# Patient Record
Sex: Female | Born: 1940 | Race: White | Hispanic: No | Smoking: Former smoker
Health system: Southern US, Community
[De-identification: ages and names within clinical notes are randomized; demographics above are authoritative.]

## PROBLEM LIST (undated history)

## (undated) DIAGNOSIS — I10 Essential (primary) hypertension: Secondary | ICD-10-CM

## (undated) DIAGNOSIS — E785 Hyperlipidemia, unspecified: Secondary | ICD-10-CM

## (undated) DIAGNOSIS — I5181 Takotsubo syndrome: Secondary | ICD-10-CM

## (undated) DIAGNOSIS — F419 Anxiety disorder, unspecified: Secondary | ICD-10-CM

## (undated) DIAGNOSIS — L409 Psoriasis, unspecified: Secondary | ICD-10-CM

## (undated) DIAGNOSIS — E119 Type 2 diabetes mellitus without complications: Secondary | ICD-10-CM

## (undated) DIAGNOSIS — Z9289 Personal history of other medical treatment: Secondary | ICD-10-CM

## (undated) DIAGNOSIS — K8689 Other specified diseases of pancreas: Secondary | ICD-10-CM

## (undated) DIAGNOSIS — C801 Malignant (primary) neoplasm, unspecified: Secondary | ICD-10-CM

## (undated) DIAGNOSIS — R51 Headache: Secondary | ICD-10-CM

## (undated) DIAGNOSIS — Z85038 Personal history of other malignant neoplasm of large intestine: Secondary | ICD-10-CM

## (undated) DIAGNOSIS — M549 Dorsalgia, unspecified: Secondary | ICD-10-CM

## (undated) HISTORY — DX: Personal history of other malignant neoplasm of large intestine: Z85.038

## (undated) HISTORY — DX: Takotsubo syndrome: I51.81

## (undated) HISTORY — DX: Type 2 diabetes mellitus without complications: E11.9

## (undated) HISTORY — DX: Hyperlipidemia, unspecified: E78.5

---

## 2007-02-20 DIAGNOSIS — C801 Malignant (primary) neoplasm, unspecified: Secondary | ICD-10-CM

## 2007-02-20 DIAGNOSIS — Z9289 Personal history of other medical treatment: Secondary | ICD-10-CM

## 2007-02-20 HISTORY — DX: Malignant (primary) neoplasm, unspecified: C80.1

## 2007-02-20 HISTORY — DX: Personal history of other medical treatment: Z92.89

## 2007-02-20 HISTORY — PX: COLOSTOMY REVERSAL: SHX5782

## 2007-05-25 ENCOUNTER — Ambulatory Visit: Payer: Self-pay | Admitting: Cardiology

## 2007-05-26 HISTORY — PX: COLOSTOMY: SHX63

## 2007-05-29 ENCOUNTER — Inpatient Hospital Stay (HOSPITAL_COMMUNITY): Admission: AD | Admit: 2007-05-29 | Discharge: 2007-06-11 | Payer: Self-pay | Admitting: Cardiology

## 2007-05-29 ENCOUNTER — Ambulatory Visit: Payer: Self-pay | Admitting: Cardiology

## 2007-06-05 ENCOUNTER — Ambulatory Visit: Payer: Self-pay | Admitting: Internal Medicine

## 2007-06-05 ENCOUNTER — Encounter: Payer: Self-pay | Admitting: Cardiology

## 2007-06-10 ENCOUNTER — Encounter: Payer: Self-pay | Admitting: Cardiology

## 2007-06-26 ENCOUNTER — Ambulatory Visit: Payer: Self-pay | Admitting: Cardiology

## 2007-07-15 ENCOUNTER — Ambulatory Visit: Payer: Self-pay | Admitting: Cardiology

## 2007-07-15 HISTORY — PX: HEMICOLECTOMY: SHX854

## 2007-07-15 HISTORY — PX: APPENDECTOMY: SHX54

## 2007-07-15 HISTORY — PX: BILATERAL SALPINGOOPHORECTOMY: SHX1223

## 2008-07-16 IMAGING — CT CT ABDOMEN W/ CM
2 of 5 series · 17 of 46 positions shown, 19 images · IV contrast (APPLIED)
Comparison: None

CT ABDOMEN

CLINICAL DATA: Recently postop for diverting colostomy for colon
mass.  Pain, fever and elevated white count.  Evaluate for abscess
or other complication.

CT ABDOMEN AND PELVIS WITH CONTRAST
TECHNIQUE: Multidetector CT imaging of the abdomen and pelvis was
performed using the standard protocol following bolus
administration of intravenous contrast.
Contrast: 80 ml Qmnipaque-H77 and oral contrast

[Series 3: abd/pelv with 5.0 b31f st · axial · 0.65mm/px · z∈[-326,+28]mm · 14 of 81 slices shown, 16 images]
[im 5/81  soft-tissue]
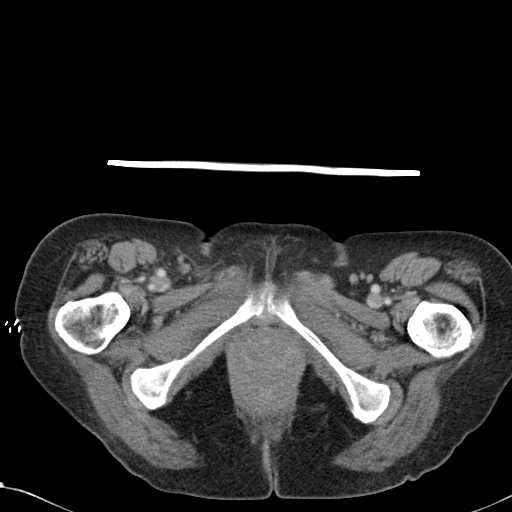
[im 5/81  bone]
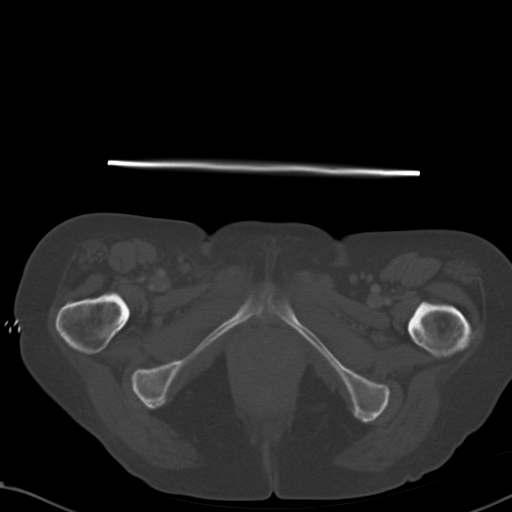
[im 9/81  soft-tissue]
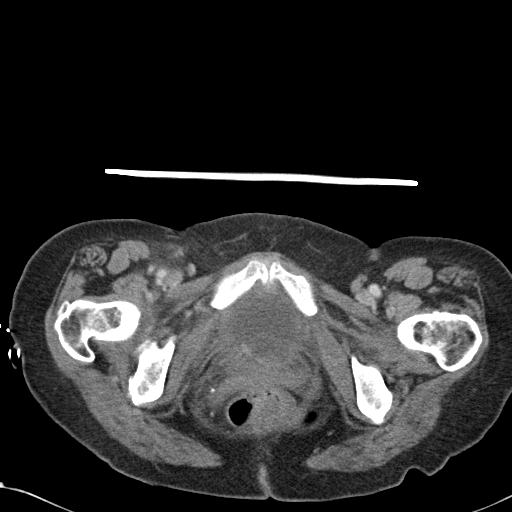
[im 18/81  soft-tissue]
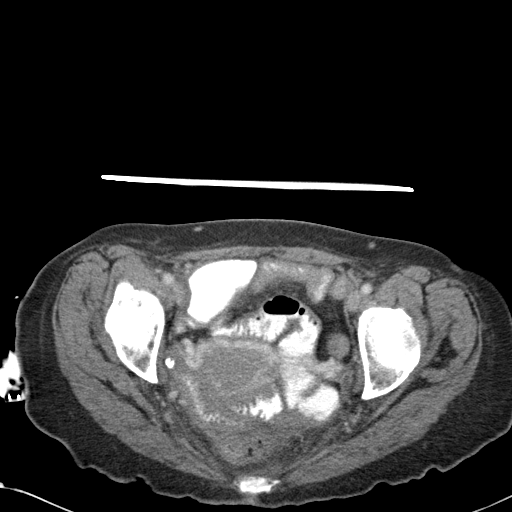
[im 23/81  soft-tissue]
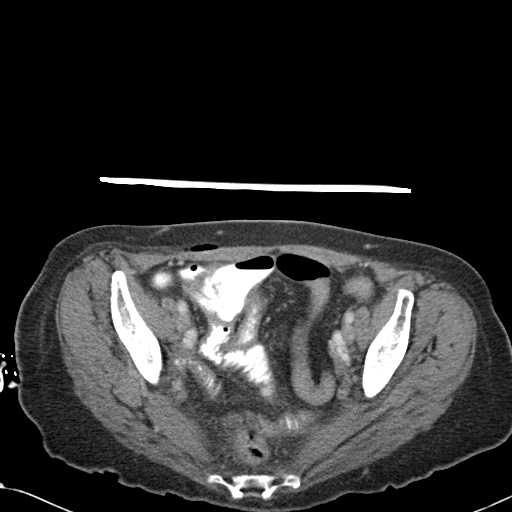
[im 27/81  soft-tissue]
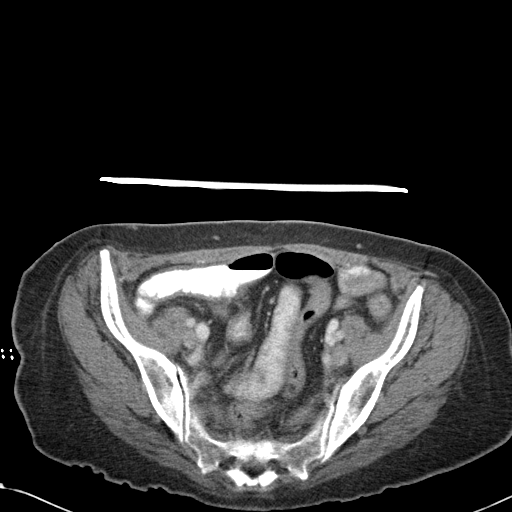
[im 32/81  soft-tissue]
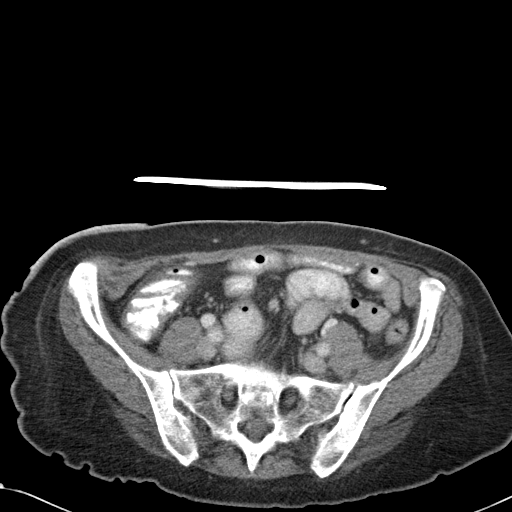
[im 36/81  soft-tissue]
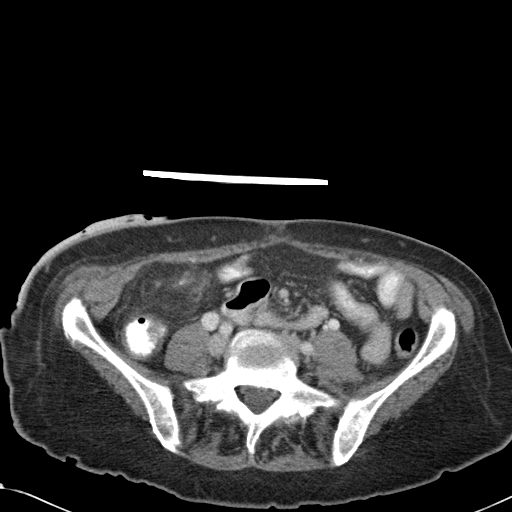
[im 45/81  soft-tissue]
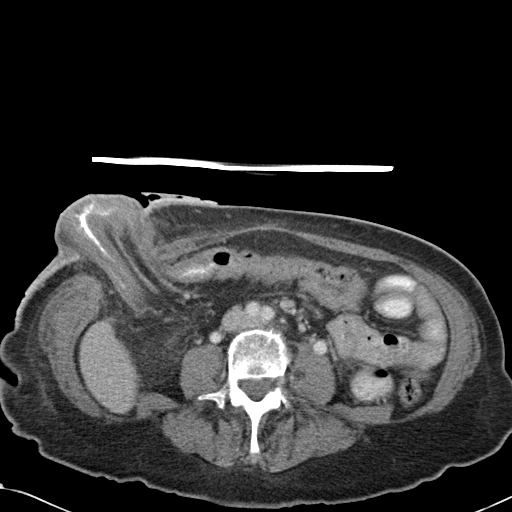
[im 49/81  soft-tissue]
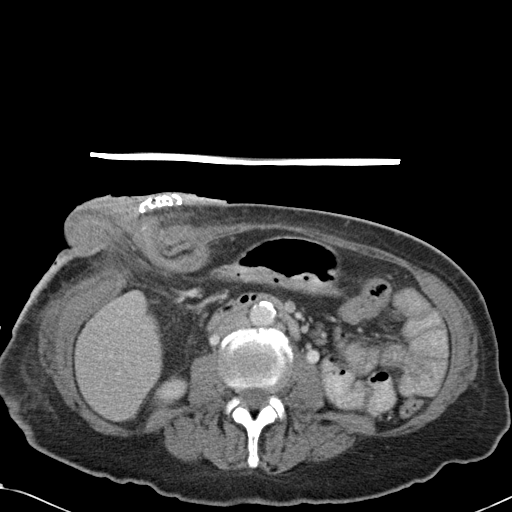
[im 49/81  bone]
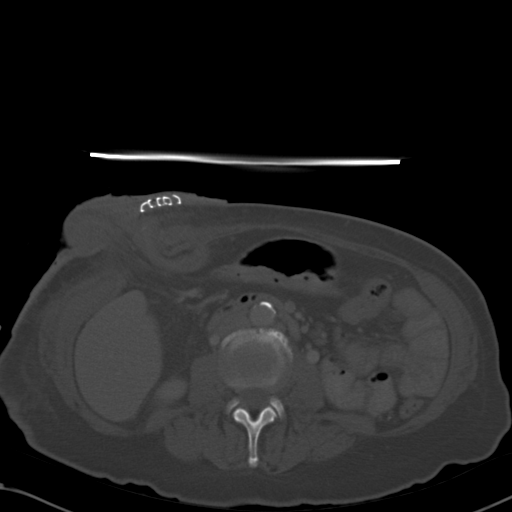
[im 54/81  soft-tissue]
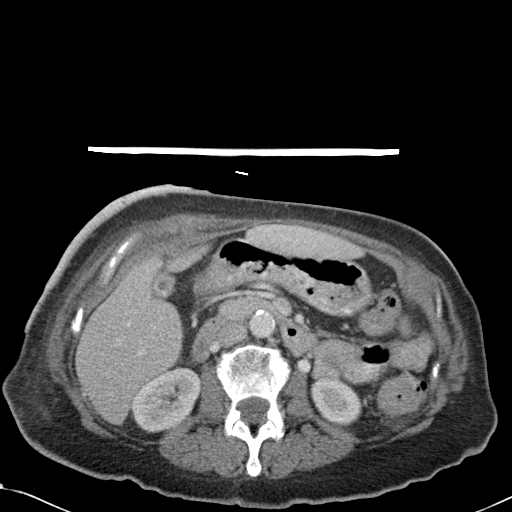
[im 58/81  soft-tissue]
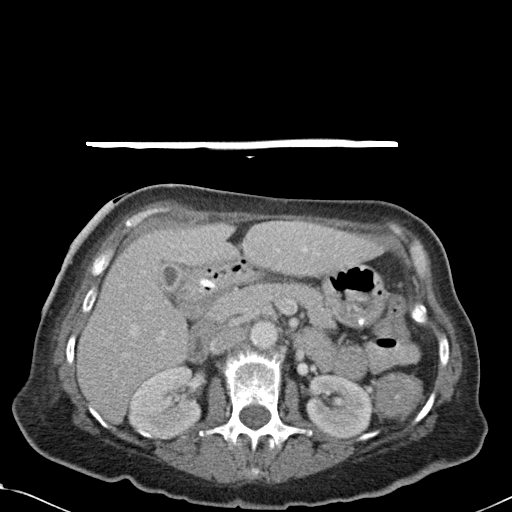
[im 63/81  soft-tissue]
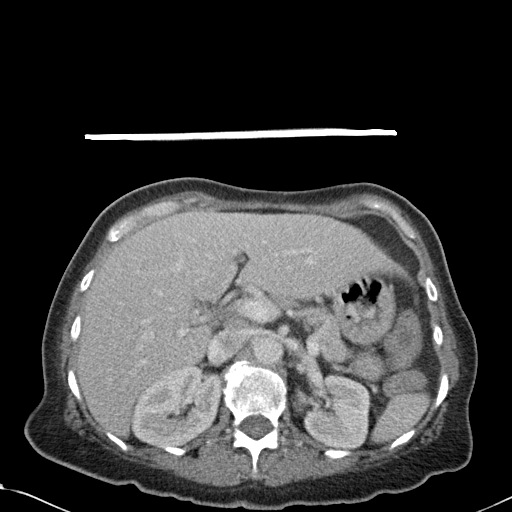
[im 72/81  soft-tissue]
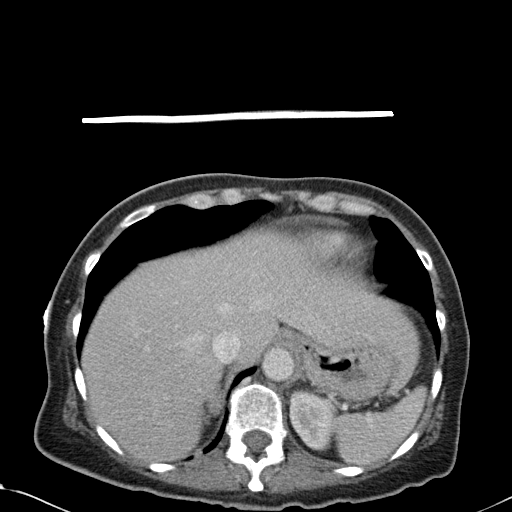
[im 76/81  soft-tissue]
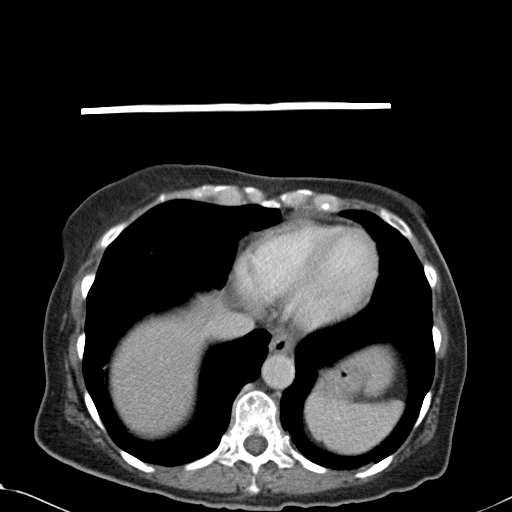

[Series 602: coronal abd/pel · coronal · 0.79mm/px · 3 of 99 slices shown]
[im 33/99  soft-tissue]
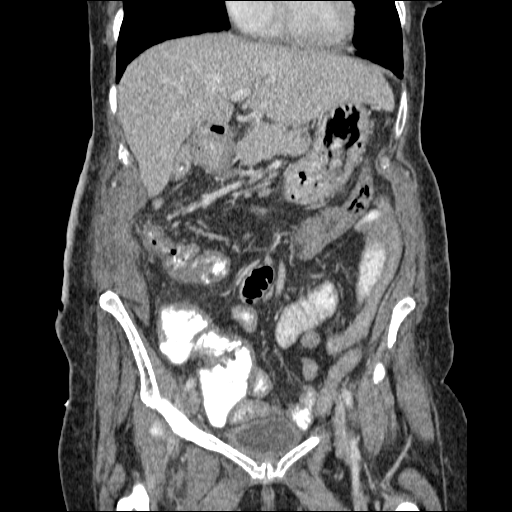
[im 44/99  soft-tissue]
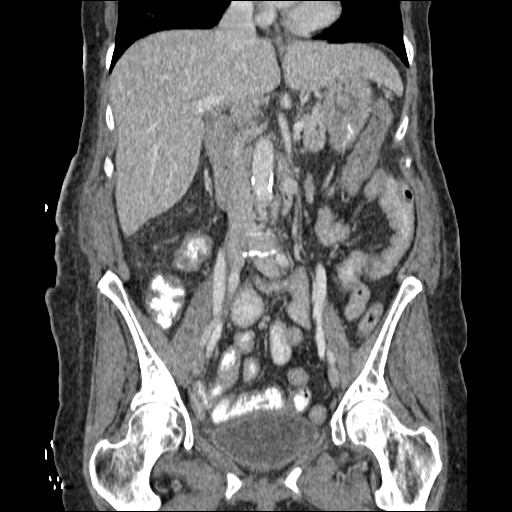
[im 55/99  soft-tissue]
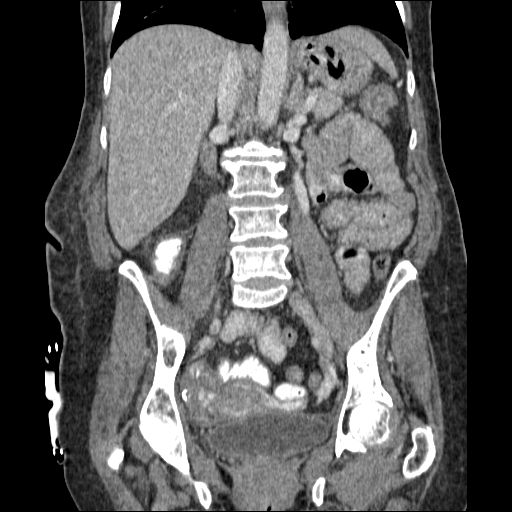

[17 of 46 positions shown; findings below may reference images not displayed]

FINDINGS: The abdominal parenchymal organs are normal in
appearance.  There is no evidence of hydronephrosis.  No mass or
adenopathy identified.

A diverting colostomy is seen within the right abdomen there is
mild wall thickening of the colostomy loop, which may be due to
postop edema, although hemorrhage, colitis, or ischemia cannot
definitely be excluded.  No other areas of bowel wall thickening
are seen there is no evidence of intra-abdominal inflammatory
process or abnormal fluid collections.  There is no evidence of
bowel obstruction. Tiny gallstone is incidentally noted.
IMPRESSION: 1.  Mild wall thickening of the diverting colostomy loop in the
right abdominal wall.  This may be due to postop edema, although
hemorrhage, colitis, or ischemia cannot definitely be excluded.
Clinical correlation is recommended.
2.  No evidence of abscess or other complication.
3.  Tiny gallstone incidentally noted.

CT PELVIS
FINDINGS: There is no evidence of pelvic mass or inflammatory
process.  There is evidence of abscess or free fluid.  There is no
evidence of dilated bowel loops.  Small bowel gas is seen in the
urinary bladder, likely due to recent bladder catheterization.
IMPRESSION: No evidence of pelvic abscess or other acute findings.

## 2010-07-04 NOTE — Cardiovascular Report (Signed)
[name], [name]         ACCOUNT NO.:  [id number]   MEDICAL RECORD NO.:  [id number]           PATIENT TYPE:   LOCATION:                                 FACILITY:   PHYSICIAN:  Arturo Morton. Riley Kill, MD, FACCDATE OF BIRTH:  1940/11/26   DATE OF PROCEDURE:  DATE OF DISCHARGE:                            CARDIAC CATHETERIZATION   INDICATIONS:  The patient is a 70 year old who underwent abdominal  surgery.  She has evidence of myocardial infarction with wall motion  abnormality and T-wave inversion in the anterior precordial leads.  Current study was done to assess coronary anatomy.  She was brought to  the lab on a dopamine drip.  Risks have been reviewed by Dr. Myrtis Ser in  detail with the family.  I hae re-reviewed these with the patient and  she was agreeable to proceed.   PROCEDURES:  1. Left heart catheterization.  2. Selective coronary arteriography.  3. Selective left ventriculography.   DESCRIPTION OF THE PROCEDURE:  The patient was brought to the cath lab  and prepped and draped in the usual fashion.  She was on a dopamine  drip.  Through an anterior puncture, the right femoral artery was easily  entered.  A 5-French sheath was then placed.  Views of the left and  right coronary were then obtained in multiple angiographic projections.  Central aortic and left ventricular pressures were measured with  pigtail.  Ventriculography was performed in the RAO projection.  I then  spoke with Dr. Myrtis Ser by phone.  The patient's family was not available  post procedure.  She tolerated the procedure well.   HEMODYNAMIC DATA:  1. Central aortic pressure 121/61, mean 85.  2. Left ventricular pressure 127/20.  3. There was no gradient pullback across the aortic valve.   ANGIOGRAPHIC DATA:  1. Ventriculography was done in the RAO projection.  Overall, left      ventricular function was diminished.  The anterobasal and      inferobasal segments moved well.  There was a large apical  wall      motion abnormality compatible with takasubo finding.  There did not      appear to be significant mitral regurgitation.  The ejection      fraction will be estimated in the 35-40% range.  2. On plain fluoroscopy, there was evidence of calcification in both      the circumflex and left anterior descending.  3. The left main is free of critical disease and is short.  4. The left anterior descending artery courses to the apex.  There are      several small diagonal branches.  There is perhaps mild      irregularity of the lumen with no more than about 20-30% narrowing      in the mid left anterior descending.  Overall, there was no      evidence of high-grade obstruction.  There were several smaller      diagonal branches.  5. The circumflex was a large-caliber vessel with minor luminal      irregularity in the midportion.  There was a very large distal  circumflex marginal branch with several sub-branches, and all of      this appears to be free of critical disease.  There was a small      atrioventricular branch, which was without significant narrowing.  6. The right coronary artery is a small-to-moderate-sized vessel      providing a smaller posterior descending and posterolateral system,      all of which appear free of critical disease.   CONCLUSIONS:  1. Overall reduction of left ventricular function with an apical wall      motion abnormality.  2. No evidence of high-grade obstruction.  3. 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   DISPOSITION:  1. We will try to taper the dopamine.  2. Her intravenous heparin will be discontinued.  3. In 24 hours to 48 hours, she will be started on low-dose Lovenox to      prevent DVT.  She will be followed by Dr. Myrtis Ser in the hospital.      Arturo Morton. Riley Kill, MD, [facility]  Electronically Signed     TDS/MEDQ  D:  05/30/2007  T:  05/31/2007  Job:  [id number]   cc:   Luis Abed, MD, [facility]  [facility]  Kathaleen Maser. Cleotis Nipper, M.D.

## 2010-07-04 NOTE — Assessment & Plan Note (Signed)
[facility]                          [facility] NOTE   NAME:SHOCKLEYRashena, [name]                MRN:          [id number]  DATE:06/26/2007                            DOB:          08-Feb-1941    [name] is here for cardiology follow up.  She is doing very well  at this time.  She had a complicated course recently.  She was in  [facility] with an abdominal mass.  She had a colostomy placed as  a temporizing procedure because further surgery could not be done at  that time.  During the surgery, there were some hemodynamic difficulties  and she had some EKGs abnormalities afterwards.  She had an echo showing  that she had the anterior and septal wall motion abnormalities, and she  had enzyme elevation, and I was quite worried about her in that setting.  We decided that to transfer her to [facility].  At [facility], she had excess  output from her colostomy.  She had persistent need for fluid and  dopamine.  Eventually, it was felt that her fluid and dopamine could be  cut back and that her systolic pressure was stable for her in the range  of 90.  She stabilized over time at [facility] and ultimately underwent  cardiac catheterization.  Her cath showed that she had mild  nonobstructive coronary disease.  It was felt that the most likely  scenario was Takotsubo cardiomyopathy.  In fact, while in the hospital,  her LV function began to improve with an ejection fraction as high as  50%.  Also, while in the hospital, she had an E-coli urinary tract  infection that was a treated successfully.  Ultimately, she was  discharged home, and I was personally in touch with Dr. Cleotis Nipper to  make sure he knew that she had been discharged home.  While at [facility], she had been seen by the surgical group including Dr. Zachery Dakins.  The rod was removed from her colostomy.  She stabilized, and she is now  doing well.  Since discharge, she has seen Dr.  Cleotis Nipper back, and her  definitive surgery is scheduled for Jul 15, 2007.   PAST MEDICAL HISTORY:   ALLERGIES:  NO KNOWN DRUG ALLERGIES.   MEDICATIONS:  1. Starlix.  2. Lipitor 20.  3. Fluoxetine.  4. Lorazepam.  5. Aspirin 81 mg.   OTHER MEDICAL PROBLEMS:  See the list below.   REVIEW OF SYSTEMS:  She is actually doing remarkably well, and her  review of systems today is negative.   PHYSICAL EXAMINATION:  VITAL SIGNS:  Blood pressure today is 90/60 with  a pulse rate of 70.  Her weight is 115 pounds.  GENERAL:  The patient is here with her husband.  She is oriented to  person, time and place.  Affect is normal.  HEENT:  No xanthelasma.  She has normal extraocular motion.  There are  no carotid bruits.  There is no jugular venous distention.  LUNGS:  Clear.  Respiratory effort is not labored.  CARDIAC:  An S1 with an S2.  There are  no clicks or significant murmurs.  ABDOMEN:  Her abdomen is soft but she has the mass that has been present  before.  EXTREMITIES:  She has no significant peripheral edema.   LABORATORY DATA:  No labs are done at this time.   PROBLEMS:  1. Mild coronary disease by catheterization.  2. Status post Takotsubo cardiomyopathy with improvement of LV      function.  3. Ejection fraction currently in the 50% range.  4. Status post a colostomy that is in place.  5. Abdominal mass for which she still needs surgery.  6. Episode of excess fluid output from her colostomy that improved      over time.  7. Continue low blood pressure.  She is not symptomatic with this.      This needs to be kept in mind over time.  This is not a sign of LV      dysfunction in her case.  8. Diabetes.  9. Hyperlipidemia.  10.Longstanding tobacco abuse.  Unfortunately, she has not stopped.  11.Status post urinary tract infection that was treated completely      while at the hospital.   I have carefully re-reviewed all of her issues.  I believe that she is  stable.  I  believe that she can undergo her surgery successfully to have  the mass removed from her abdomen.  No further cardiac workup is needed.  We will be available to help as needed.     Luis Abed, MD, [facility]  Electronically Signed    JDK/MedQ  DD: 06/26/2007  DT: 06/26/2007  Job #: [id number]   cc:   Sherilyn Cooter A. Cleotis Nipper, M.D.

## 2010-07-04 NOTE — Consult Note (Signed)
[name], [name]         ACCOUNT NO.:  [id number]   MEDICAL RECORD NO.:  [id number]          PATIENT TYPE:  INP   LOCATION:  2901                         FACILITY:  [facility]   PHYSICIAN:  Letha Cape, PA    DATE OF BIRTH:  1940-12-08   DATE OF CONSULTATION:  06/02/2007  DATE OF DISCHARGE:                                 CONSULTATION   REQUESTING PHYSICIAN:  Luis Abed, MD, [facility]   PRIMARY SURGEON:  Kathaleen Maser. Cleotis Nipper, M.D.   CONSULTING SURGEON:  Anselm Pancoast. Weatherly, M.D.   TIME OF CONSULTATION:  From 11:05 a.m. to 12:05 p.m.   REASON FOR CONSULTATION:  High colostomy output.   HISTORY OF PRESENT ILLNESS:  This is a 70 year old white female with a  history of diabetes as well as dyslipidemia who recently had a diverting  colostomy done by Dr. Cleotis Nipper in [facility] on April 6 for an  obstructing colon mass.  His plans were to take her back in  approximately 1 month, at that time to resect the colon mass.  However,  during surgery the patient began to exhibit signs of hypotension as well  as possible myocardial infarction changes on her EKG.  At that time, the  patient was transferred here to [facility], and a cardiology consult was  obtained at that time.  The patient was then taken to the cath lab which  showed clear arteries with a decrease in ejection fraction of 35%-40%.  At this time, that led to a diagnosis of Tako-Tsubo syndrome.  At this  time, the patient was placed on dopamine as well as large amounts of IV  fluids to help maintain the patient's blood pressure.  After speaking to  the patient, the patient states that she has had this high output from  her colostomy ever since her surgery.  This high output is to be  expected the first several days after surgery due to the patient being  obstructive prior to surgery.  However, the last several days could be  from a number of other things.  The patient states that at this time she  is tolerating a  diet without any nausea or vomiting or any abdominal  pain.  She is having some soft formed stools in her ostomy with a tan  liquid otherwise.  The patient is also having an increase in urine  output, but has a slightly low BUN of 4 and a creatinine of 0.53.  At  this time, we were consulted to help manage the patient's high output  from her colostomy.   REVIEW OF SYSTEMS:  See HPI.  Otherwise, all other systems at this time  are negative.   FAMILY HISTORY:  Noncontributory at this time.   PAST MEDICAL HISTORY:  Includes:  1. Diabetes mellitus type 2.  2. Dyslipidemia.  3. Depression   PAST SURGICAL HISTORY:  1. Tonsillectomy.  2. Status post diverting colostomy on May 26, 2007.   SOCIAL HISTORY:  The patient is 70 years old and is currently married to  her husband.  Together they have 4 grown children, 2 boys and 2 girls.  The patient states that she has smoked since she was in her early 33s  approximately 2 packs a day.  She denies any use of alcohol or other  illicit drugs.  The patient is also currently retired and lives in  [place], [place].   ALLERGIES:  None.   MEDICATIONS:  1. Ativan 0.5 mg t.i.d.  2. Starlix 120 mg t.i.d.  3. Prozac 20 mg daily.  4. Lipitor 10 mg daily.  5. Ibuprofen over-the-counter as needed.   PHYSICAL EXAM:  GENERAL:  This is a pleasant 70 year old white female  who is lying in bed in no acute distress.  VITALS:  Temperature 98, blood pressure 93/58, pulse 77, and  respirations 23.  EYES:  Sclerae are noninjected.  Pupils equal, round, and reactive to  light.  EARS, NOSE, MOUTH, AND THROAT:  Ears and nose no obvious masses or  lesions or rhinorrhea.  Mouth reveals poor dentition.  Otherwise pink  and moist.  Throat shows no exudate.  NECK:  Supple.  Trachea midline.  No thyromegaly.  LUNGS:  Clear to auscultation bilaterally with no wheezes, rhonchi, or  rales noted.  Respiratory effort is normal.  HEART:  Regular rate and rhythm.   Normal S1 and S2 with no obvious  murmurs, gallops, or rubs noted.  Carotid +2 and pedal pulses are noted  bilaterally.  Chest is symmetrical.  ABDOMEN:  Soft, nondistended, and slightly tender around her colostomy.  She does have active bowel sounds.  Her stoma at this time is pink with  about 80% beefy red tissue and 20% fluff.  Her stoma at this time is  quite edematous and it is noted that she has a rod in place.  At this  time, the rod is causing some leakage with periostomy skin excoriation.  Otherwise, at this time she has some air in her bag with some soft  formed stools, otherwise with tan liquids.  MUSCULOSKELETAL:  All 4 extremities are symmetrical with no cyanosis,  clubbing, or edema.  SKIN:  No obvious masses, lesions, or rashes.  NEURO:  Cranial nerves II-XII are grossly intact.  Deep tendon reflex  exam is deferred at this time.  PSYCH:  The patient is alert and oriented x3 with an appropriate affect.   LABS AND DIAGNOSTICS:  White blood cell count is 10,200, hemoglobin  11.3, hematocrit 33.1, and platelets 335,000.  Sodium 134, potassium  2.8, BUN 4, creatinine 0.53, and glucose 157.  BNP 355.  Her albumin  which was measured on May 30, 2007, is 1.8.  There are no diagnostics  at this time.   IMPRESSION:  1. High colostomy output.  2. Status post diverting colostomy on May 26, 2007, by Dr. Cleotis Nipper      at [facility].  3. Hypokalemia.  4. Hypoalbuminemia.  5. Tako-Tsubo syndrome.  6. Diabetes.   PLAN:  At this time, we feel that the patient is probably having a  significant amount of output in her colostomy due to this significant  amount of IV fluids that she is receiving because of her hypotension.  Therefore at this time, we would recommend that if possible to begin  decreasing her IV fluids to see if this will help decrease her colostomy  output.  Otherwise, we will check an abdominal x-ray as well as the  patient's stools for Clostridium  difficile to rule out any other  possible reason for her high output.  As far as the leaking around her  colostomy, I  have discussed this with the wound ostomy care nurse and  she states that she is leaking due to not being able to get a good seal  around the colostomy due to the patient's rod.  She states that usually  this is discontinued after 7-10 days, and she recommends that her  primary physician speak with Dr. Cleotis Nipper to see if at this time it is  okay to remove the rod.  This would then allow for a better seal on the  patient's colostomy and would also decrease the amount of skin  excoriation the patient has around her colostomy.  Otherwise, at this  time any further recommendations will be per Dr. Zachery Dakins after his  evaluation of the patient.      Letha Cape, PA     KEO/MEDQ  D:  06/02/2007  T:  06/03/2007  Job:  [id number]   cc:   Luis Abed, MD, [facility]  Kathaleen Maser. Cleotis Nipper, M.D.

## 2010-07-04 NOTE — Discharge Summary (Signed)
[name], [name]         ACCOUNT NO.:  [id number]   MEDICAL RECORD NO.:  [id number]          PATIENT TYPE:  INP   LOCATION:  2010                         FACILITY:  [facility]   PHYSICIAN:  Gerrit Friends. Dietrich Pates, MD, FACCDATE OF BIRTH:  [birth year]   DATE OF ADMISSION:  05/29/2007  DATE OF DISCHARGE:                               DISCHARGE SUMMARY   PRIMARY CARDIOLOGIST:  Luis Abed, MD.   GENERAL SURGEON:  Kathaleen Maser. Cleotis Nipper, MD, in [place]   DISCHARGE DIAGNOSIS:  Non-ST-elevation myocardial infarction.   SECONDARY DIAGNOSES:  1. Takotsubo cardiomyopathy  with an ejection fraction of 35-40% by 2-      D echocardiogram at [facility], improved to 50% on April 21      here at [facility].  2. Type 2 diabetes mellitus.  3. Hyperlipidemia.  4. Longstanding tobacco abuse.  5. Family history of coronary artery disease.  6. 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  7. Excess output from her colostomy.  8. Hypotension requiring IV fluid resuscitation and vasopressors.  9. Urinary tract infection   ALLERGIES:  No known drug allergies.   PROCEDURES:  1. Left heart cardiac catheterization.  2. CT of the abdomen and pelvis showing wall thickening of diverting      colostomy loop in the right abdominal wall.  No abscess or other      complication.  No pelvic mass or other acute findings.   HISTORY OF PRESENT ILLNESS:  A 70 year old female who was admitted to  [facility] May 25, 2007, secondary to progressive abdominal  discomfort.  A CT of her abdomen was performed showing an obstructing  mass in the proximal descending colon suspicious for colon carcinoma as  well as a 2.5-mm retroperitoneal lymph node.  This was suspicious for  metastatic disease.  The patient was evaluated by Dr. Cleotis Nipper and  underwent a transverse loop colostomy on April 6 for decompression with  a plan for a resection  of obstructing mass in the future.  The patient  tolerated the procedure, however postoperatively became tachycardic with  rates in the 160s and was also noted to be hypotensive, requiring fluid  resuscitation and dopamine.  Cardiac markers were evaluated and her  troponin was elevated at 0.57.  Cardiology was consulted and a 2-D  echocardiogram performed revealing an EF of 35-40% with anteroseptal and  apical wall motion abnormality.  A decision was made at this point to  transfer the patient to [facility] for further evaluation and management  of non-ST-elevation MI.   HOSPITAL COURSE:  Following arrival to [facility] the patient was noted  to have excess colostomy output with persistent hypotension requiring IV  fluids and dopamine therapy.  A left heart cardiac catheterization was  performed April 10 revealing nonobstructive coronary artery disease with  an EF of 35-40% with anteroapical akinesis.  It was felt that in this  scenario her troponin and wall motion abnormalities were most likely  related to takotsubo cardiomyopathy.  Hypotension prevented initiation  of beta blocker or ACE inhibitor therapy.  Due to persistent excess  colostomy drainage, general surgery was consulted as well as wound care.  Both recommended removal of the rod from the stoma and once this was  performed, she was noted to have decrease in drainage.  Stool for C.  difficile was sent off and was negative.  On April 16 Ms. Pelzer was  noted to have an elevated white count of 956[address] as well as intermittent  low-grade fevers.  A urinalysis was performed and did confirm UTI.  The  patient was seen by infectious disease secondary to her leukocytosis and  complex case and initially was treated with broad-spectrum antibiotics.  Urine culture grew greater than 1000 colonies of E. coli and the  patient's antibiotics were switched to Rocephin and then finally oral  amoxicillin, which she will require for additional  days of.  Repeat  echocardiography was performed April 16 to reevaluate her LV function  and at that time it had improved to 50-55%.  Follow-up echo April 21  showed an EF of 50% with mild septal hypokinesis and mild periapical  hypokinesis.  Overall Ms. Bibby has improved significantly.  We have  arranged for a home health RN for ostomy care.  She will follow up with  Dr. Cleotis Nipper in the near future with plans for colectomy down the road.  We will also arrange for follow-up with Dr. Myrtis Ser in [place].   DISCHARGE LABS:  Hemoglobin 9.9, hematocrit 28.1, WBC 4.4, platelets  452.  Sodium 139, potassium 3.5, chloride 105, CO2 25, BUN 9, creatinine  0.53, glucose 114..  Total cholesterol 72, triglycerides 101, HDL 19,  LDL 33.  Urinalysis showed positive nitrite, 11-20 wbc's, many bacteria.  Urine culture grew E. coli.  C. difficile was negative.  Blood cultures  were negative.   DISPOSITION:  The patient is being discharged home today in good  condition.   FOLLOW-UP PLANS AND APPOINTMENTS:  We have arranged for follow-up with  Dr. Myrtis Ser on May 7 at 1:45 p.m.  She will follow up with Dr. Cleotis Nipper as  previously arranged.   DISCHARGE MEDICATIONS:  1. Aspirin 81 mg daily.  2. Prozac 20 mg daily.  3. Starlix 120 mg t.i.d. with meals.  4. Ativan 0.5 mg t.i.d.  5. Amoxicillin 500 mg t.i.d. x4 more days.  6. Multivitamin daily.  7. Lipitor 10 mg daily.   OUTSTANDING LAB STUDIES:  None.   DURATION OF DISCHARGE ENCOUNTER:  90 minutes including physician time.      Nicolasa Ducking, ANP      Gerrit Friends. Dietrich Pates, MD, [facility]  Electronically Signed    CB/MEDQ  D:  06/11/2007  T:  06/11/2007  Job:  [id number]   cc:   Sherilyn Cooter A. Cleotis Nipper, M.D.

## 2010-11-14 LAB — BASIC METABOLIC PANEL
BUN: 3 — ABNORMAL LOW
BUN: 4 — ABNORMAL LOW
BUN: 6
BUN: 9
BUN: 9
CO2: 24
CO2: 24
CO2: 28
Calcium: 8.2 — ABNORMAL LOW
Calcium: 8.7
Calcium: 9.4
Chloride: 103
Chloride: 104
Chloride: 106
Chloride: 110
Chloride: 102
Chloride: 98
Creatinine, Ser: 0.48
Creatinine, Ser: 0.49
Creatinine, Ser: 0.52
Creatinine, Ser: 0.53
Creatinine, Ser: 0.53
Creatinine, Ser: 0.53
Creatinine, Ser: 0.66
GFR calc Af Amer: >60
GFR calc Af Amer: >60
GFR calc Af Amer: >60
GFR calc Af Amer: >60
GFR calc Af Amer: >60
GFR calc non Af Amer: >60
GFR calc non Af Amer: >60
GFR calc non Af Amer: >60
GFR calc non Af Amer: >60
GFR calc non Af Amer: >60
GFR calc non Af Amer: >60
Glucose, Bld: 137 — ABNORMAL HIGH
Glucose, Bld: 157 — ABNORMAL HIGH
Glucose, Bld: 114 — ABNORMAL HIGH
Glucose, Bld: 114 — ABNORMAL HIGH
Glucose, Bld: 120 — ABNORMAL HIGH
Potassium: 2.8 — ABNORMAL LOW
Potassium: 3.2 — ABNORMAL LOW
Potassium: 3.2 — ABNORMAL LOW
Potassium: 3.3 — ABNORMAL LOW
Potassium: 3.6
Potassium: 3.7
Potassium: 4.1
Potassium: 4.2
Sodium: 137
Sodium: 137
Sodium: 135
Sodium: 138

## 2010-11-14 LAB — URINE MICROSCOPIC-ADD ON

## 2010-11-14 LAB — URINALYSIS, ROUTINE W REFLEX MICROSCOPIC
Bilirubin Urine: NEGATIVE
Glucose, UA: NEGATIVE
Ketones, ur: NEGATIVE
Ketones, ur: NEGATIVE
Nitrite: POSITIVE — AB
Protein, ur: NEGATIVE
Protein, ur: NEGATIVE
Urobilinogen, UA: 0.2
pH: 5.5
pH: 6

## 2010-11-14 LAB — HEPARIN LEVEL (UNFRACTIONATED)
Heparin Unfractionated: <0.1 — ABNORMAL LOW
Heparin Unfractionated: <0.1 — ABNORMAL LOW

## 2010-11-14 LAB — CBC
HCT: 33.1 — ABNORMAL LOW
HCT: 33.3 — ABNORMAL LOW
HCT: 35.1 — ABNORMAL LOW
HCT: 38.6
HCT: 28.6 — ABNORMAL LOW
HCT: 31.9 — ABNORMAL LOW
Hemoglobin: 11.3 — ABNORMAL LOW
Hemoglobin: 11.6 — ABNORMAL LOW
Hemoglobin: 12
Hemoglobin: 13.1
Hemoglobin: 11.9 — ABNORMAL LOW
Hemoglobin: 9.9 — ABNORMAL LOW
MCHC: 33.8
MCHC: 35
MCHC: 34.2
MCHC: 35.3
MCV: 91.2
MCV: 91.5
MCV: 91.7
MCV: 90.4
MCV: 91.2
Platelets: 309
Platelets: 317
Platelets: 325
Platelets: 424 — ABNORMAL HIGH
Platelets: 452 — ABNORMAL HIGH
RBC: 3.48 — ABNORMAL LOW
RBC: 3.63 — ABNORMAL LOW
RBC: 3.64 — ABNORMAL LOW
RBC: 3.85 — ABNORMAL LOW
RBC: 4.22
RBC: 3.13 — ABNORMAL LOW
RBC: 3.47 — ABNORMAL LOW
RBC: 3.84 — ABNORMAL LOW
RDW: 13.1
RDW: 13.4
RDW: 13.7
RDW: 13.7
RDW: 13.4
WBC: 10.2
WBC: 10.4
WBC: 12.4 — ABNORMAL HIGH
WBC: 7.8
WBC: 8.8
WBC: 11.4 — ABNORMAL HIGH
WBC: 6.5
WBC: 8.5

## 2010-11-14 LAB — CLOSTRIDIUM DIFFICILE EIA: C difficile Toxins A+B, EIA: NEGATIVE

## 2010-11-14 LAB — COMPREHENSIVE METABOLIC PANEL
ALT: 14
AST: 14
Albumin: 1.8 — ABNORMAL LOW
Alkaline Phosphatase: 57
Chloride: 109
GFR calc Af Amer: >60
Potassium: 3.6
Sodium: 137
Total Bilirubin: 0.5

## 2010-11-14 LAB — DIFFERENTIAL
Basophils Relative: 0
Eosinophils Relative: 1
Lymphocytes Relative: 13
Lymphs Abs: 1.9
Monocytes Absolute: 1.1 — ABNORMAL HIGH
Monocytes Relative: 8
Neutro Abs: 10.9 — ABNORMAL HIGH

## 2010-11-14 LAB — MAGNESIUM
Magnesium: 1.2 — ABNORMAL LOW
Magnesium: 1.3 — ABNORMAL LOW
Magnesium: 1.5
Magnesium: 1.6

## 2010-11-14 LAB — URINE CULTURE

## 2010-11-14 LAB — CULTURE, BLOOD (ROUTINE X 2): Culture: NO GROWTH

## 2010-11-14 LAB — LIPID PANEL
HDL: 19 — ABNORMAL LOW
LDL Cholesterol: 33
Triglycerides: 101
VLDL: 20

## 2010-11-14 LAB — CORTISOL-AM, BLOOD: Cortisol - AM: 13.7

## 2012-03-24 ENCOUNTER — Encounter: Payer: Self-pay | Admitting: Physician Assistant

## 2012-03-25 ENCOUNTER — Encounter: Payer: Self-pay | Admitting: *Deleted

## 2012-03-26 ENCOUNTER — Ambulatory Visit (INDEPENDENT_AMBULATORY_CARE_PROVIDER_SITE_OTHER): Payer: Medicare Other | Admitting: Physician Assistant

## 2012-03-26 ENCOUNTER — Encounter: Payer: Self-pay | Admitting: Physician Assistant

## 2012-03-26 VITALS — BP 96/59 | HR 79 | Ht 63.0 in | Wt 142.8 lb

## 2012-03-26 DIAGNOSIS — I5181 Takotsubo syndrome: Secondary | ICD-10-CM

## 2012-03-26 DIAGNOSIS — E119 Type 2 diabetes mellitus without complications: Secondary | ICD-10-CM | POA: Insufficient documentation

## 2012-03-26 DIAGNOSIS — E785 Hyperlipidemia, unspecified: Secondary | ICD-10-CM | POA: Insufficient documentation

## 2012-03-26 NOTE — Assessment & Plan Note (Signed)
Followed by primary M.D. Patient is on statin therapy.

## 2012-03-26 NOTE — Assessment & Plan Note (Signed)
Followed by primary M.D. Recommend target LDL 100 or less, if feasible.

## 2012-03-26 NOTE — Patient Instructions (Addendum)
   Echo  Office will contact with results Your physician wants you to follow up in:  1 year.  You will receive a reminder letter in the mail one-two months in advance.  If you don't receive a letter, please call our office to schedule the follow up appointment

## 2012-03-26 NOTE — Assessment & Plan Note (Signed)
Will order an echocardiogram for reassessment of LVF, and rule out of underlying structural abnormalities. Cardiac catheterization in April 2009 yielded nonobstructive CAD with a large apical wall motion abnormality, consistent with Takotsubo cardiomyopathy; EF 35-40%.  If this confirms normalization of LVF, then no further workup is indicated. Patient presents with no interim history of symptoms suggestive of significant underlying CAD.

## 2012-03-26 NOTE — Progress Notes (Signed)
Primary Cardiologist: Jerral Bonito, MD   HPI: Patient returns to the clinic after a prolonged hiatus, last seen here in May 2009, by Dr. Myrtis Ser.  She has history of Tako-tsubo cardiomyopathy with subsequent improvement in LVF to approximately 50%. When last seen, she was cleared by Dr. Myrtis Ser to proceed with surgical resection of an abdominal mass.  She was recently seen for routine followup and a 12-lead EKG was done. Of note, however, patient did not present with complaint of chest pain or SOB. EKG indicated NSR 71 bpm, with Q waves in leads V1 and V2, suggestive of septal infarct. She is now referred for further evaluation.  She reports no interim development of exertional CP or SOB. Regarding her history of an abdominal mass, she reports today that this was diagnosed as cancer. She apparently underwent successful surgical resection, and required no further treatment.   Allergies  Allergen Reactions  . Penicillins Diarrhea    GI upset    Current Outpatient Prescriptions  Medication Sig Dispense Refill  . aspirin 81 MG tablet Take 81 mg by mouth at bedtime.      Marland Kitchen FLUoxetine (PROZAC) 40 MG capsule Take 40 mg by mouth every morning.      Marland Kitchen glipiZIDE (GLUCOTROL) 5 MG tablet Take 2.5 mg by mouth at bedtime.      Marland Kitchen lisinopril (PRINIVIL,ZESTRIL) 5 MG tablet Take 5 mg by mouth every evening.      . metFORMIN (GLUCOPHAGE) 500 MG tablet Take 500 mg by mouth 2 (two) times daily with a meal.      . nateglinide (STARLIX) 120 MG tablet Take 120 mg by mouth 3 (three) times daily before meals.      . simvastatin (ZOCOR) 20 MG tablet Take 20 mg by mouth every evening.        Past Medical History  Diagnosis Date  . H/O colon cancer, stage III   . Diabetes mellitus   . Takotsubo cardiomyopathy   . HLD (hyperlipidemia)     Past Surgical History  Procedure Date  . Colostomy 05/26/07    for obstructing neoplasm in her descending colon  . Hemicolectomy 07/15/07    extended left hemicolectomy  w/primary anastomosis  . Bilateral salpingoophorectomy 07/15/07  . Appendectomy 07/15/07    History   Social History  . Marital Status: Married    Spouse Name: N/A    Number of Children: N/A  . Years of Education: N/A   Occupational History  . Not on file.   Social History Main Topics  . Smoking status: Former Smoker -- 2.0 packs/day    Types: Cigarettes    Quit date: 02/20/2007  . Smokeless tobacco: Not on file  . Alcohol Use: Not on file  . Drug Use: Not on file  . Sexually Active: Not on file   Other Topics Concern  . Not on file   Social History Narrative  . No narrative on file    No family history on file.  ROS: no nausea, vomiting; no fever, chills; no melena, hematochezia; no claudication  PHYSICAL EXAM: BP 96/59  Pulse 79  Ht 5\' 3"  (1.6 m)  Wt 142 lb 12.8 oz (64.774 kg)  BMI 25.30 kg/m2  SpO2 96% GENERAL:72 year old female; NAD HEENT: NCAT, PERRLA, EOMI; sclera clear; no xanthelasma NECK: palpable bilateral carotid pulses, no bruits; no JVD; no TM LUNGS: CTA bilaterally CARDIAC: RRR (S1, S2); no significant murmurs; no rubs or gallops ABDOMEN: soft, non-tender; intact BS EXTREMETIES: no significant peripheral edema SKIN: warm/dry;  no obvious rash/lesions MUSCULOSKELETAL: no joint deformity NEURO: no focal deficit; NL affect   EKG:    ASSESSMENT & PLAN:  Takotsubo cardiomyopathy Will order an echocardiogram for reassessment of LVF, and rule out of underlying structural abnormalities. Cardiac catheterization in April 2009 yielded nonobstructive CAD with a large apical wall motion abnormality, consistent with Takotsubo cardiomyopathy; EF 35-40%.  If this confirms normalization of LVF, then no further workup is indicated. Patient presents with no interim history of symptoms suggestive of significant underlying CAD.  Diabetes mellitus Followed by primary M.D. Patient is on statin therapy.  HLD (hyperlipidemia) Followed by primary M.D. Recommend  target LDL 100 or less, if feasible.    Gene Wilian Kwong, PAC

## 2012-04-10 ENCOUNTER — Other Ambulatory Visit (INDEPENDENT_AMBULATORY_CARE_PROVIDER_SITE_OTHER): Payer: Medicare Other

## 2012-04-10 ENCOUNTER — Other Ambulatory Visit: Payer: Self-pay

## 2012-04-10 DIAGNOSIS — I5181 Takotsubo syndrome: Secondary | ICD-10-CM

## 2012-04-11 ENCOUNTER — Telehealth: Payer: Self-pay | Admitting: *Deleted

## 2012-04-11 NOTE — Telephone Encounter (Signed)
Notes Recorded by Lesle Chris, LPN on 09/29/9145 at 2:46 PM Patient notified.

## 2012-04-11 NOTE — Telephone Encounter (Signed)
Message copied by Lesle Chris on Fri Apr 11, 2012  2:46 PM ------      Message from: Prescott Parma C      Created: Thu Apr 10, 2012  1:04 PM       EF 55-60%; no WMAs ------

## 2013-11-05 ENCOUNTER — Telehealth: Payer: Self-pay

## 2013-11-05 ENCOUNTER — Other Ambulatory Visit: Payer: Self-pay

## 2013-11-05 DIAGNOSIS — K8689 Other specified diseases of pancreas: Secondary | ICD-10-CM

## 2013-11-05 NOTE — Telephone Encounter (Signed)
EUS scheduled, pt instructed and medications reviewed.  Patient instructions mailed to home.  Patient to call with any questions or concerns. Instructed to bring the disc of her CT with her to procedure on 11/19/13. Referring notified.

## 2013-11-09 ENCOUNTER — Encounter (HOSPITAL_COMMUNITY): Payer: Self-pay | Admitting: Pharmacy Technician

## 2013-11-10 ENCOUNTER — Encounter (HOSPITAL_COMMUNITY): Payer: Self-pay | Admitting: *Deleted

## 2013-11-19 ENCOUNTER — Encounter (HOSPITAL_COMMUNITY)
Admission: RE | Disposition: A | Discharge status: Home / Self Care | Payer: Self-pay | Source: Ambulatory Visit | Attending: Gastroenterology

## 2013-11-19 ENCOUNTER — Encounter (HOSPITAL_COMMUNITY): Payer: Medicare Other | Admitting: Registered Nurse

## 2013-11-19 ENCOUNTER — Ambulatory Visit (HOSPITAL_COMMUNITY): Payer: Medicare Other | Admitting: Registered Nurse

## 2013-11-19 ENCOUNTER — Encounter (HOSPITAL_COMMUNITY): Payer: Self-pay | Admitting: *Deleted

## 2013-11-19 ENCOUNTER — Telehealth: Payer: Self-pay

## 2013-11-19 ENCOUNTER — Ambulatory Visit (HOSPITAL_COMMUNITY)
Admission: RE | Admit: 2013-11-19 | Discharge: 2013-11-19 | Disposition: A | Discharge status: Home / Self Care | Payer: Medicare Other | Source: Ambulatory Visit | Attending: Gastroenterology | Admitting: Gastroenterology

## 2013-11-19 DIAGNOSIS — K862 Cyst of pancreas: Secondary | ICD-10-CM | POA: Diagnosis present

## 2013-11-19 DIAGNOSIS — C259 Malignant neoplasm of pancreas, unspecified: Secondary | ICD-10-CM | POA: Diagnosis not present

## 2013-11-19 DIAGNOSIS — I10 Essential (primary) hypertension: Secondary | ICD-10-CM | POA: Diagnosis not present

## 2013-11-19 DIAGNOSIS — E785 Hyperlipidemia, unspecified: Secondary | ICD-10-CM | POA: Insufficient documentation

## 2013-11-19 DIAGNOSIS — Z85038 Personal history of other malignant neoplasm of large intestine: Secondary | ICD-10-CM | POA: Diagnosis not present

## 2013-11-19 DIAGNOSIS — E119 Type 2 diabetes mellitus without complications: Secondary | ICD-10-CM | POA: Insufficient documentation

## 2013-11-19 DIAGNOSIS — Z87891 Personal history of nicotine dependence: Secondary | ICD-10-CM | POA: Insufficient documentation

## 2013-11-19 DIAGNOSIS — L409 Psoriasis, unspecified: Secondary | ICD-10-CM | POA: Diagnosis not present

## 2013-11-19 DIAGNOSIS — Z880 Allergy status to penicillin: Secondary | ICD-10-CM | POA: Insufficient documentation

## 2013-11-19 DIAGNOSIS — F419 Anxiety disorder, unspecified: Secondary | ICD-10-CM | POA: Diagnosis not present

## 2013-11-19 DIAGNOSIS — K8689 Other specified diseases of pancreas: Secondary | ICD-10-CM

## 2013-11-19 DIAGNOSIS — I5181 Takotsubo syndrome: Secondary | ICD-10-CM | POA: Insufficient documentation

## 2013-11-19 DIAGNOSIS — K869 Disease of pancreas, unspecified: Secondary | ICD-10-CM

## 2013-11-19 HISTORY — DX: Personal history of other medical treatment: Z92.89

## 2013-11-19 HISTORY — DX: Essential (primary) hypertension: I10

## 2013-11-19 HISTORY — DX: Headache: R51

## 2013-11-19 HISTORY — DX: Other specified diseases of pancreas: K86.89

## 2013-11-19 HISTORY — DX: Psoriasis, unspecified: L40.9

## 2013-11-19 HISTORY — DX: Malignant (primary) neoplasm, unspecified: C80.1

## 2013-11-19 HISTORY — DX: Dorsalgia, unspecified: M54.9

## 2013-11-19 HISTORY — PX: EUS: SHX5427

## 2013-11-19 HISTORY — DX: Anxiety disorder, unspecified: F41.9

## 2013-11-19 LAB — GLUCOSE, CAPILLARY: Glucose-Capillary: 206 mg/dL — ABNORMAL HIGH (ref 70–99)

## 2013-11-19 SURGERY — UPPER ENDOSCOPIC ULTRASOUND (EUS) LINEAR
Anesthesia: Monitor Anesthesia Care

## 2013-11-19 MED ORDER — PROPOFOL INFUSION 10 MG/ML OPTIME
INTRAVENOUS | Status: DC | PRN
  Administered 2013-11-19: 300 ug/kg/min via INTRAVENOUS

## 2013-11-19 MED ORDER — PROPOFOL 10 MG/ML IV BOLUS
INTRAVENOUS | Status: AC
  Filled 2013-11-19: qty 20

## 2013-11-19 MED ORDER — SODIUM CHLORIDE 0.9 % IV SOLN: INTRAVENOUS | Status: DC

## 2013-11-19 MED ORDER — ONDANSETRON HCL 4 MG/2ML IJ SOLN
INTRAMUSCULAR | Status: DC | PRN
  Administered 2013-11-19: 4 mg via INTRAVENOUS

## 2013-11-19 MED ORDER — PROMETHAZINE HCL 25 MG/ML IJ SOLN: 6.2500 mg | INTRAMUSCULAR | Status: DC | PRN

## 2013-11-19 MED ORDER — LACTATED RINGERS IV SOLN
INTRAVENOUS | Status: DC
  Administered 2013-11-19: 1000 mL via INTRAVENOUS

## 2013-11-19 MED ORDER — FENTANYL CITRATE 0.05 MG/ML IJ SOLN: 25.0000 ug | INTRAMUSCULAR | Status: DC | PRN

## 2013-11-19 MED ORDER — GLYCOPYRROLATE 0.2 MG/ML IJ SOLN
INTRAMUSCULAR | Status: DC | PRN
  Administered 2013-11-19: 0.2 mg via INTRAVENOUS

## 2013-11-19 MED ORDER — LACTATED RINGERS IV SOLN
INTRAVENOUS | Status: DC | PRN
  Administered 2013-11-19: 07:00:00 via INTRAVENOUS

## 2013-11-19 MED ORDER — LIDOCAINE HCL (CARDIAC) 20 MG/ML IV SOLN
INTRAVENOUS | Status: DC | PRN
  Administered 2013-11-19: 50 mg via INTRAVENOUS

## 2013-11-19 NOTE — Telephone Encounter (Signed)
Referral received and faxed to the cancer center in [facility]

## 2013-11-19 NOTE — Anesthesia Postprocedure Evaluation (Signed)
  Anesthesia Post-op Note  Patient: [name]  Procedure(s) Performed: Procedure(s) (LRB): UPPER ENDOSCOPIC ULTRASOUND (EUS) LINEAR (N/A)  Patient Location: PACU  Anesthesia Type: MAC  Level of Consciousness: awake and alert   Airway and Oxygen Therapy: Patient Spontanous Breathing  Post-op Pain: mild  Post-op Assessment: Post-op Vital signs reviewed, Patient's Cardiovascular Status Stable, Respiratory Function Stable, Patent Airway and No signs of Nausea or vomiting  Last Vitals:  Filed Vitals:   11/19/13 0910  BP: 117/69  Pulse: 83  Temp:   Resp: 18    Post-op Vital Signs: stable   Complications: No apparent anesthesia complications

## 2013-11-19 NOTE — Anesthesia Preprocedure Evaluation (Addendum)
Anesthesia Evaluation  Patient identified by MRN, date of birth, ID band Patient awake    Reviewed: Allergy & Precautions, H&P , NPO status , Patient's Chart, lab work & pertinent test results  Airway Mallampati: II TM Distance: >3 FB Neck ROM: Full    Dental no notable dental hx.    Pulmonary neg pulmonary ROS, former smoker,  breath sounds clear to auscultation  Pulmonary exam normal       Cardiovascular hypertension, Pt. on medications Rhythm:Regular Rate:Normal  Takotsubo cardiomyopathy   Neuro/Psych Anxiety negative neurological ROS     GI/Hepatic negative GI ROS, Neg liver ROS,   Endo/Other  diabetes, Insulin Dependent  Renal/GU negative Renal ROS  negative genitourinary   Musculoskeletal negative musculoskeletal ROS (+)   Abdominal   Peds negative pediatric ROS (+)  Hematology negative hematology ROS (+)   Anesthesia Other Findings   Reproductive/Obstetrics negative OB ROS                          Anesthesia Physical Anesthesia Plan  ASA: III  Anesthesia Plan: MAC   Post-op Pain Management:    Induction: Intravenous  Airway Management Planned: Nasal Cannula  Additional Equipment:   Intra-op Plan:   Post-operative Plan:   Informed Consent: I have reviewed the patients History and Physical, chart, labs and discussed the procedure including the risks, benefits and alternatives for the proposed anesthesia with the patient or authorized representative who has indicated his/her understanding and acceptance.   Dental advisory given  Plan Discussed with: CRNA and Surgeon  Anesthesia Plan Comments:         Anesthesia Quick Evaluation

## 2013-11-19 NOTE — H&P (Signed)
  HPI: This is a 73 yo woman recently admitted to [facility], found to have 6.8cm mixed solid cyst mass in body/tail of pancreas, also 1.3 cm lesion in liver right lobe.  H.o colon cancer 2009    Past Medical History  Diagnosis Date  . H/O colon cancer, stage III   . Takotsubo cardiomyopathy   . HLD (hyperlipidemia)   . Hypertension   . Diabetes mellitus   . Anxiety   . Headache(784.0)   . Back pain last 2 months    lower back  . Cancer 2009    colon cancer  . History of blood transfusion 2009  . Pancreatic mass   . Psoriasis     head and left ear    Past Surgical History  Procedure Laterality Date  . Colostomy  05/26/07    for obstructing neoplasm in her descending colon  . Hemicolectomy  07/15/07    extended left hemicolectomy w/primary anastomosis  . Bilateral salpingoophorectomy  07/15/07    fallopian tubes removed also  . Appendectomy  07/15/07  . Colostomy reversal  2009    Current Facility-Administered Medications  Medication Dose Route Frequency Provider Last Rate Last Dose  . 0.9 %  sodium chloride infusion   Intravenous Continuous Milus Banister, MD      . fentaNYL (SUBLIMAZE) injection 25-50 mcg  25-50 mcg Intravenous Q5 min PRN Myrtie Soman, MD      . lactated ringers infusion   Intravenous Continuous Milus Banister, MD 125 mL/hr at 11/19/13 0709 1,000 mL at 11/19/13 0709  . promethazine (PHENERGAN) injection 6.25-12.5 mg  6.25-12.5 mg Intravenous Q15 min PRN Myrtie Soman, MD        Allergies as of 11/05/2013 - Review Complete 03/26/2012  Allergen Reaction Noted  . Penicillins Diarrhea 03/26/2012    History reviewed. No pertinent family history.  History   Social History  . Marital Status: Widowed    Spouse Name: N/A    Number of Children: N/A  . Years of Education: N/A   Occupational History  . Not on file.   Social History Main Topics  . Smoking status: Former Smoker -- 2.00 packs/day for 20 years    Types: Cigarettes    Quit date:  02/20/2007  . Smokeless tobacco: Never Used  . Alcohol Use: No  . Drug Use: No  . Sexual Activity: Not on file   Other Topics Concern  . Not on file   Social History Narrative  . No narrative on file      Physical Exam: BP 111/59  Pulse 72  Temp(Src) 98.1 F (36.7 C) (Oral)  Resp 17  Ht 5\' 3"  (1.6 m)  Wt 138 lb (62.596 kg)  BMI 24.45 kg/m2  SpO2 98% Constitutional: generally well-appearing Psychiatric: alert and oriented x3 Abdomen: soft, nontender, nondistended, no obvious ascites, no peritoneal signs, normal bowel sounds     Assessment and plan: 73 y.o. female with mass in pancreas, liver  Will proceed with EUS evaluation, FNA today

## 2013-11-19 NOTE — Op Note (Signed)
[facility] [place], [address]   ENDOSCOPIC ULTRASOUND PROCEDURE REPORT  PATIENT: [name], [name]  MR#: [id number] BIRTHDATE: [birth year]  GENDER: female ENDOSCOPIST: Milus Banister, MD REFERRED BY:  Monica Becton, MD at [place]:  11/19/2013 PROCEDURE:   Upper EUS w/FNA ASA CLASS:      Class II INDICATIONS:   1.  2 months of abdominal, back pain, weight loss: CT at [facility] shows 6.8cm mixed solid/cyst mass in body/tail of pancreas; also 1.3cm lesion in right lobe of liver. MEDICATIONS: Monitored anesthesia care  DESCRIPTION OF PROCEDURE:   After the risks benefits and alternatives of the procedure were  explained, informed consent was obtained. The patient was then placed in the left, lateral, decubitus postion and IV sedation was administered. Throughout the procedure, the patients blood pressure, pulse and oxygen saturations were monitored continuously.  Under direct visualization, the Pentax Radial EUS [id number]  endoscope was introduced through the mouth  and advanced to the bulb of duodenum .  Water was used as necessary to provide an acoustic interface. Upon completion of the imaging, water was removed and the patient was sent to the recovery room in satisfactory condition.   Endoscopic findings (limited views with linear echoendoscope): 1. Normal UGI  EUS findings: 1. Large hypoechoic mass with somewhat distinct borders in the body of pancreas, measured 4-5cm at least. There were cystic components to the otherwise solid mass. The mass clearly involves several large blood vessels on this examination (best idetified on outside CT). The mass was sampled with 3 transgastric passes with a 25 gauge EUS FNA needle, suction. 2. Several small but suspicious appearing peripancreatic lympnhodes were seen (at least 3). One of them was sampled with a differenct 25 gauge EUS FNA transgastric pass. 3. Main  pancreatic duct was very dilated proximal to the mass described above, measured 9-72mm in tail. 4. The head of pancreas appeared normal 5. CBD was normal, non-dilated 6. Limited views of liver, spleen, portal and splenic vessels were all normal.  IMPRESSION: Large mixed (solid/cystic) mass in the body of pancreas that clearly involves, invades portal vein, SMV, splenic vessels, celiac trunk (vascular detail best visualized on outside CT). The mass was sampled with FNA and a small but suspicious peripancreatic lymhnode was also sampled.  Preliminary cytology review (from pancreatic mass) shows neoplastic cells, favoring adenocarcinoma. Await final cytology from pancreatic mass and also peripancreatic lymphnode.  RECOMMENDATIONS: My office will refer to medical oncology at the [facility].  _______________________________ eSigned:  Milus Banister, MD 11/19/2013 9:12 AM   CC: Kassie Mends, PA

## 2013-11-19 NOTE — Transfer of Care (Signed)
Immediate Anesthesia Transfer of Care Note  Patient: [name]  Procedure(s) Performed: Procedure(s) with comments: UPPER ENDOSCOPIC ULTRASOUND (EUS) LINEAR (N/A) - radial linear  Patient Location: PACU  Anesthesia Type:MAC  Level of Consciousness: awake, alert , oriented and patient cooperative  Airway & Oxygen Therapy: Patient Spontanous Breathing and Patient connected to face mask oxygen  Post-op Assessment: Report given to PACU RN, Post -op Vital signs reviewed and stable and Patient moving all extremities X 4  Post vital signs: stable  Complications: No apparent anesthesia complications

## 2013-11-19 NOTE — Telephone Encounter (Signed)
Message copied by Barron Alvine on Thu Nov 19, 2013  9:33 AM ------      Message from: Owens Loffler P      Created: Thu Nov 19, 2013  9:15 AM       Lilith Solana,      She needs referral to medical oncology for newly diagnosed pancreatic cancer (unclear tissue type yet).  Has CT from [facility] that she will hand carry to any appt.            Also can you please put on for next GI cancer conference            Thanks       ------

## 2013-11-19 NOTE — Telephone Encounter (Signed)
(  [phone] phone  [facility] will fax a referral form to be sent for appt

## 2013-11-19 NOTE — Telephone Encounter (Signed)
[name],  She actually prefers to go to [facility] in [place], will need to refer her there instead. Can cancel the request for gi tumor board. Thanks

## 2013-11-19 NOTE — Discharge Instructions (Signed)

## 2013-11-20 ENCOUNTER — Encounter (HOSPITAL_COMMUNITY): Payer: Self-pay | Admitting: Gastroenterology

## 2014-02-19 DEATH — deceased
